# Patient Record
Sex: Female | Born: 1950 | Hispanic: No
Health system: Southern US, Community
[De-identification: ages and names within clinical notes are randomized; demographics above are authoritative.]

---

## 2015-08-03 ENCOUNTER — Other Ambulatory Visit (HOSPITAL_COMMUNITY): Payer: Self-pay | Admitting: Internal Medicine

## 2015-08-03 DIAGNOSIS — Z992 Dependence on renal dialysis: Principal | ICD-10-CM

## 2015-08-03 DIAGNOSIS — N186 End stage renal disease: Secondary | ICD-10-CM

## 2015-08-04 ENCOUNTER — Other Ambulatory Visit (HOSPITAL_COMMUNITY): Payer: Self-pay | Admitting: Internal Medicine

## 2015-08-04 ENCOUNTER — Ambulatory Visit (HOSPITAL_COMMUNITY)
Admission: RE | Admit: 2015-08-04 | Discharge: 2015-08-04 | Disposition: A | Discharge status: Home / Self Care | Payer: Medicare Other | Source: Ambulatory Visit | Attending: Internal Medicine | Admitting: Internal Medicine

## 2015-08-04 DIAGNOSIS — Z992 Dependence on renal dialysis: Principal | ICD-10-CM

## 2015-08-04 DIAGNOSIS — T82858A Stenosis of vascular prosthetic devices, implants and grafts, initial encounter: Secondary | ICD-10-CM | POA: Insufficient documentation

## 2015-08-04 DIAGNOSIS — Y832 Surgical operation with anastomosis, bypass or graft as the cause of abnormal reaction of the patient, or of later complication, without mention of misadventure at the time of the procedure: Secondary | ICD-10-CM | POA: Diagnosis not present

## 2015-08-04 DIAGNOSIS — N186 End stage renal disease: Secondary | ICD-10-CM

## 2015-08-04 LAB — GLUCOSE, CAPILLARY: Glucose-Capillary: 234 mg/dL — ABNORMAL HIGH (ref 65–99)

## 2015-08-04 MED ORDER — LIDOCAINE HCL 1 % IJ SOLN
INTRAMUSCULAR | Status: AC
  Filled 2015-08-04: qty 20

## 2015-08-04 MED ORDER — IOHEXOL 300 MG/ML  SOLN
100.0000 mL | Freq: Once | INTRAMUSCULAR | Status: AC | PRN
  Administered 2015-08-04: 50 mL via INTRAVENOUS

## 2015-08-04 NOTE — Sedation Documentation (Addendum)
Pt in IR 1, trach collar at 4L/[place]. D10 infusing at [facility] due to NPO status although had apple juice this am per [facility].  CBG 200 0n departure from [facility]. Pt alert, following commands, cooperative.  [place] RT using [place] on left arm graft.

## 2015-08-04 NOTE — Progress Notes (Signed)
Pt came here from [facility] for L upperr arm fistulogram.  Awaiting [facility] to transport back to [facility]. VS stable.

## 2017-09-10 IMAGING — US IR AV DIALY SHUNT INTRO NEEDLE/INTRACATH INITIAL W/PTA/IMG*L*
1 series · 1 of 1 positions shown · non-contrast
Comparison: None

CLINICAL DATA: End stage renal disease, elevated arterial pressures
with prolonged fistula bleeding after hemodialysis

EXAM:
DIALYSIS FISTULOGRAM
VENOUS ANGIOPLASTY
FLUOROSCOPY TIME:  2 minutes 18 seconds, 42 mGy
TECHNIQUE: The procedure, risks (including but not limited to bleeding,
infection, organ damage ), benefits, and alternatives were explained
to the patient's daughter over the phone. Questions regarding the
procedure were encouraged and answered. she understands and consents
to the procedure. An 18-gauge angiocatheter was placed antegrade
into the arterial limb of the patient's left upper armAV
hemodialysis fistula for dialysis fistulography. The angiocatheter
was removed and hemostasis achieved. Overlying Skin were then
prepped with Betadine, draped in usual sterile fashion, infiltrated
locally with 1% lidocaine.Under real-time ultrasound guidance, the
outflow vein was accessed more centrally retrograde with a 21 gauge
micropuncture needle, exchanged over a 018 guidewire for
transitional dilator. This was exchanged over a Benson wire for a 6
French vascular sheath, through which a 5 French Kumpe the catheter
advanced into the different left brachial artery. The Kumpe the was
exchanged over the Benson wire for a 6 mm x 4 cm most angioplasty
balloon, advanced to the level of outflow vein stenoses for venous
angioplasty using 60 second overlapping inflations at 20
atmospheres. After follow-up venography, the catheter, sheath, and
guidewire were removed and hemostasis achieved with a 2-0 Ethilon
purse-string suture. No immediate complication.

[Series 1: ir (id) (id)/(id)/(id) · 1 of 1 slices shown]
[im 1/1]
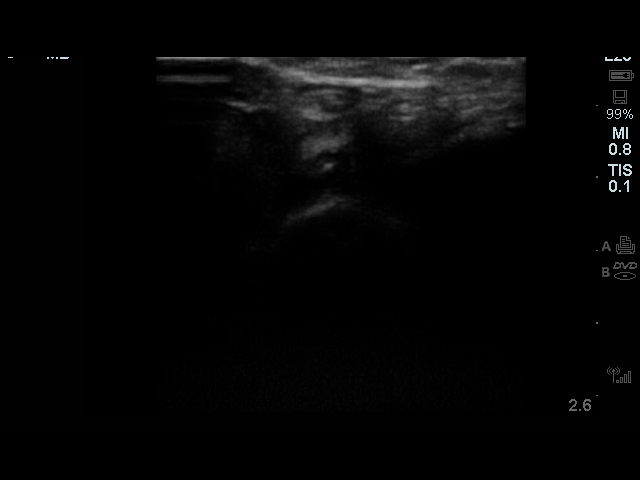

[1 of 1 positions shown; findings below may reference images not displayed]

FINDINGS: There are tandem high-grade stenoses in outflow vein several cm
central to the fistula. The more central aspect of the outflow
cephalic vein is widely patent. The left subclavian and innominate
veins are widely patent despite the presence of subclavian AICD and
left IJ tunneled hemodialysis catheter. SVC is patent. No
significant central collateral filling. The fistula with the
brachial artery at the elbow is widely patent.

The tandem outflow cephalic vein stenoses responded well to 6 mm
balloon angioplasty. Follow-up venography showed no significant
residual/ recurrent stenosis, extravasation, dissection, or other
apparent complication. The patient tolerated the procedure well.
IMPRESSION: 1. Tandem outflow cephalic vein stenoses, with good response to 6 mm
balloon angioplasty.

ACCESS:
Remains approachable for percutaneous intervention as needed.
